# Patient Record
Sex: Male | Born: 1970 | Race: White | Hispanic: Yes | Marital: Single | Smoking: Never smoker
Health system: Southern US, Community
[De-identification: ages and names within clinical notes are randomized; demographics above are authoritative.]

## PROBLEM LIST (undated history)

## (undated) HISTORY — PX: APPENDECTOMY: SHX54

---

## 2016-06-11 ENCOUNTER — Encounter (HOSPITAL_COMMUNITY): Payer: Self-pay | Admitting: Emergency Medicine

## 2016-06-11 ENCOUNTER — Emergency Department (HOSPITAL_COMMUNITY): Payer: Self-pay

## 2016-06-11 ENCOUNTER — Emergency Department (HOSPITAL_COMMUNITY)
Admission: EM | Admit: 2016-06-11 | Discharge: 2016-06-11 | Disposition: A | Discharge status: Home / Self Care | Payer: Self-pay | Attending: Emergency Medicine | Admitting: Emergency Medicine

## 2016-06-11 DIAGNOSIS — Y9389 Activity, other specified: Secondary | ICD-10-CM | POA: Insufficient documentation

## 2016-06-11 DIAGNOSIS — S81811A Laceration without foreign body, right lower leg, initial encounter: Secondary | ICD-10-CM | POA: Insufficient documentation

## 2016-06-11 DIAGNOSIS — Y92009 Unspecified place in unspecified non-institutional (private) residence as the place of occurrence of the external cause: Secondary | ICD-10-CM | POA: Insufficient documentation

## 2016-06-11 DIAGNOSIS — W260XXA Contact with knife, initial encounter: Secondary | ICD-10-CM | POA: Insufficient documentation

## 2016-06-11 DIAGNOSIS — Z23 Encounter for immunization: Secondary | ICD-10-CM | POA: Insufficient documentation

## 2016-06-11 DIAGNOSIS — Y998 Other external cause status: Secondary | ICD-10-CM | POA: Insufficient documentation

## 2016-06-11 MED ORDER — HYDROCODONE-ACETAMINOPHEN 5-325 MG PO TABS: 1.0000 | ORAL_TABLET | Freq: Four times a day (QID) | ORAL | 0 refills | Status: AC | PRN

## 2016-06-11 MED ORDER — TETANUS-DIPHTH-ACELL PERTUSSIS 5-2.5-18.5 LF-MCG/0.5 IM SUSP
0.5000 mL | Freq: Once | INTRAMUSCULAR | Status: AC
  Administered 2016-06-11: 0.5 mL via INTRAMUSCULAR
  Filled 2016-06-11: qty 0.5

## 2016-06-11 MED ORDER — HYDROCODONE-ACETAMINOPHEN 5-325 MG PO TABS
1.0000 | ORAL_TABLET | Freq: Once | ORAL | Status: AC
  Administered 2016-06-11: 1 via ORAL
  Filled 2016-06-11: qty 1

## 2016-06-11 MED ORDER — LIDOCAINE-EPINEPHRINE (PF) 1 %-1:200000 IJ SOLN
INTRAMUSCULAR | Status: AC
  Filled 2016-06-11: qty 30

## 2016-06-11 MED ORDER — BACITRACIN ZINC 500 UNIT/GM EX OINT
TOPICAL_OINTMENT | Freq: Once | CUTANEOUS | Status: AC
  Administered 2016-06-11: 1 via TOPICAL
  Filled 2016-06-11: qty 0.9

## 2016-06-11 MED ORDER — LIDOCAINE-EPINEPHRINE 1 %-1:100000 IJ SOLN
20.0000 mL | Freq: Once | INTRAMUSCULAR | Status: DC
  Filled 2016-06-11: qty 20

## 2016-06-11 MED ORDER — POVIDONE-IODINE 10 % EX SOLN
CUTANEOUS | Status: AC
  Filled 2016-06-11: qty 118

## 2016-06-11 NOTE — ED Notes (Signed)
Medication applied to laceration, covered and gauze applied, pt a/o x 4, vss, RX and verbal and written discharge instructions given, verbalized understanding, pt ambulated off unit with steady gait

## 2016-06-11 NOTE — ED Triage Notes (Signed)
Cutting grass at home with a knife and accidentally cut in right leg.

## 2016-06-11 NOTE — ED Provider Notes (Signed)
[facility] DEPT Provider Note   CSN: [id number] Arrival date & time: 06/11/16  1741     History   Chief Complaint Chief Complaint  Patient presents with  . Laceration    HPI Derrall Hicks is a 46 y.o. male.   Laceration   The laceration is located on the right leg. The laceration is 4 cm in size. The laceration mechanism was a a metal edge. The pain is mild. The pain has been fluctuating since onset. He reports no foreign bodies present. His tetanus status is unknown.   46 year old male was mowing and a piece of the blade broke cutting his right leg just above the knee. Just prior to arrival. Worsened by movement better with rest. No others associated symptoms. Unknown last tetanus shot.  History reviewed. No pertinent past medical history.  There are no active problems to display for this patient.   Past Surgical History:  Procedure Laterality Date  . APPENDECTOMY         Home Medications    Prior to Admission medications   Medication Sig Start Date End Date Taking? Authorizing Provider  HYDROcodone-acetaminophen (NORCO/VICODIN) 5-325 MG tablet Take 1-2 tablets by mouth every 6 (six) hours as needed for severe pain. 06/11/16   Timiko Offutt, Barbara Cower, MD    Family History No family history on file.  Social History Social History  Substance Use Topics  . Smoking status: Never Smoker  . Smokeless tobacco: Never Used  . Alcohol use No     Allergies   Patient has no known allergies.   Review of Systems Review of Systems  All other systems reviewed and are negative.    Physical Exam Updated Vital Signs BP 137/77 (BP Location: Right Arm)   Pulse 83   Temp 98.2 F (36.8 C) (Oral)   Resp 20   Ht 5' 6.93" (1.7 m)   Wt 246 lb 14.6 oz (112 kg)   SpO2 97%   BMI 38.75 kg/m   Physical Exam  Constitutional: He is oriented to person, place, and time. He appears well-developed and well-nourished.  HENT:  Head: Normocephalic and atraumatic.  Eyes: Conjunctivae  and EOM are normal.  Neck: Normal range of motion.  Cardiovascular: Normal rate.   Pulmonary/Chest: Effort normal. No respiratory distress.  Abdominal: Soft. He exhibits no distension.  Musculoskeletal: Normal range of motion.  Neurological: He is alert and oriented to person, place, and time. No cranial nerve deficit. Coordination normal.  Skin: Skin is warm and dry.  4 cm hemostatic wound just above the right knee.  Nursing note and vitals reviewed.    ED Treatments / Results  Labs (all labs ordered are listed, but only abnormal results are displayed) Labs Reviewed - No data to display  EKG  EKG Interpretation None       Radiology Dg Knee 2 Views Right  Result Date: 06/11/2016 CLINICAL DATA:  Evaluation for foreign body following lawnmower injury to the right knee EXAM: RIGHT KNEE - 1-2 VIEW COMPARISON:  None. FINDINGS: No fracture or dislocation at the right knee. There is a large suprapatellar effusion with anterior soft tissue injury. No radiopaque foreign body. IMPRESSION: Large suprapatellar effusion. No radiopaque foreign body. No acute osseous abnormality. Electronically Signed   By: Deatra Robinson M.D.   On: 06/11/2016 19:00    Procedures .Marland KitchenLaceration Repair Date/Time: 06/11/2016 8:01 PM Performed by: Marily Memos Authorized by: Marily Memos   Consent:    Consent obtained:  Verbal   Consent given by:  Patient  Risks discussed:  Pain, infection, poor cosmetic result and poor wound healing   Alternatives discussed:  No treatment and delayed treatment Anesthesia (see MAR for exact dosages):    Anesthesia method:  Local infiltration   Local anesthetic:  Lidocaine 1% WITH epi Laceration details:    Location:  Leg   Leg location:  R upper leg   Length (cm):  4   Depth (mm):  12 Repair type:    Repair type:  Intermediate Pre-procedure details:    Preparation:  Patient was prepped and draped in usual sterile fashion and imaging obtained to evaluate for foreign  bodies Exploration:    Hemostasis achieved with:  Epinephrine   Wound exploration: wound explored through full range of motion and entire depth of wound probed and visualized     Wound extent: areolar tissue violated and fascia violated     Wound extent: no foreign bodies/material noted, no muscle damage noted, no nerve damage noted, no tendon damage noted, no underlying fracture noted and no vascular damage noted     Contaminated: no   Treatment:    Area cleansed with:  Betadine and saline   Amount of cleaning:  Standard   Irrigation solution:  Sterile saline   Irrigation volume:  300   Irrigation method:  Syringe   Visualized foreign bodies/material removed: no   Subcutaneous repair:    Suture size:  4-0   Suture material:  Vicryl   Suture technique:  Simple interrupted   Number of sutures:  3 Skin repair:    Repair method:  Sutures   Suture size:  3-0   Suture material:  Prolene   Suture technique:  Simple interrupted   Number of sutures:  9 Approximation:    Approximation:  Close   Vermilion border: well-aligned   Post-procedure details:    Dressing:  Antibiotic ointment   Patient tolerance of procedure:  Tolerated well, no immediate complications   (including critical care time)  Medications Ordered in ED Medications  povidone-iodine (BETADINE) 10 % external solution (not administered)  lidocaine-EPINEPHrine (XYLOCAINE W/EPI) 1 %-1:100000 (with pres) injection 20 mL (not administered)  lidocaine-EPINEPHrine (XYLOCAINE-EPINEPHrine) 1 %-1:200000 (PF) injection (not administered)  HYDROcodone-acetaminophen (NORCO/VICODIN) 5-325 MG per tablet 1 tablet (1 tablet Oral Given 06/11/16 1831)  Tdap (BOOSTRIX) injection 0.5 mL (0.5 mLs Intramuscular Given 06/11/16 1826)  bacitracin ointment (1 application Topical Given 06/11/16 1952)     Initial Impression / Assessment and Plan / ED Course  I have reviewed the triage vital signs and the nursing notes.  Pertinent labs & imaging  results that were available during my care of the patient were reviewed by me and considered in my medical decision making (see chart for details).     Lac repaired as above. TDAP updated. No evidence of patella injury or patellar tendon. No foreign bodies. NVI distally. Strong DP pulse. Will return in 10 days for suture removal.   Final Clinical Impressions(s) / ED Diagnoses   Final diagnoses:  Laceration of right lower extremity, initial encounter    New Prescriptions New Prescriptions   HYDROCODONE-ACETAMINOPHEN (NORCO/VICODIN) 5-325 MG TABLET    Take 1-2 tablets by mouth every 6 (six) hours as needed for severe pain.     Marily MemosMesner, Donnel Venuto, MD 06/11/16 2004

## 2017-09-29 IMAGING — DX DG KNEE 1-2V*R*
2 series · 2 of 2 positions shown · non-contrast
Comparison: None.

CLINICAL DATA: Evaluation for foreign body following lawnmower
injury to the right knee

EXAM:
RIGHT KNEE - 1-2 VIEW

[knee ap]
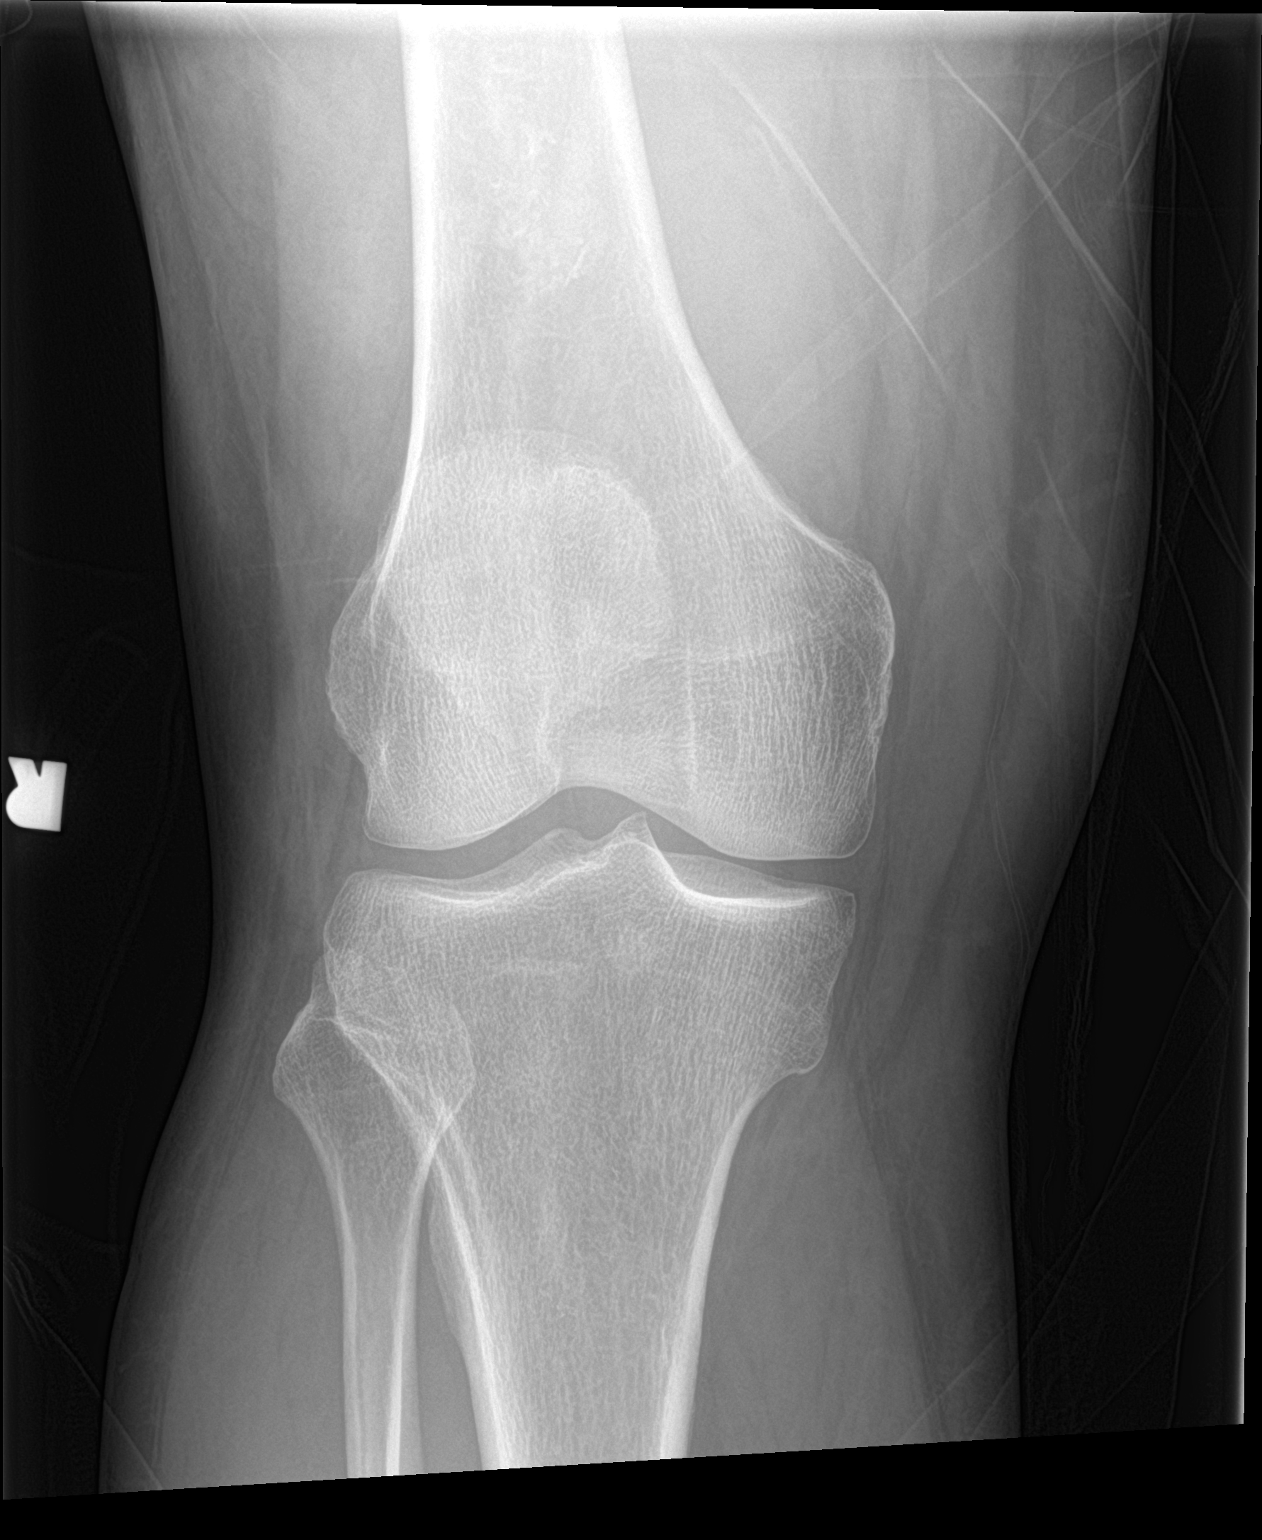

[knee lat]
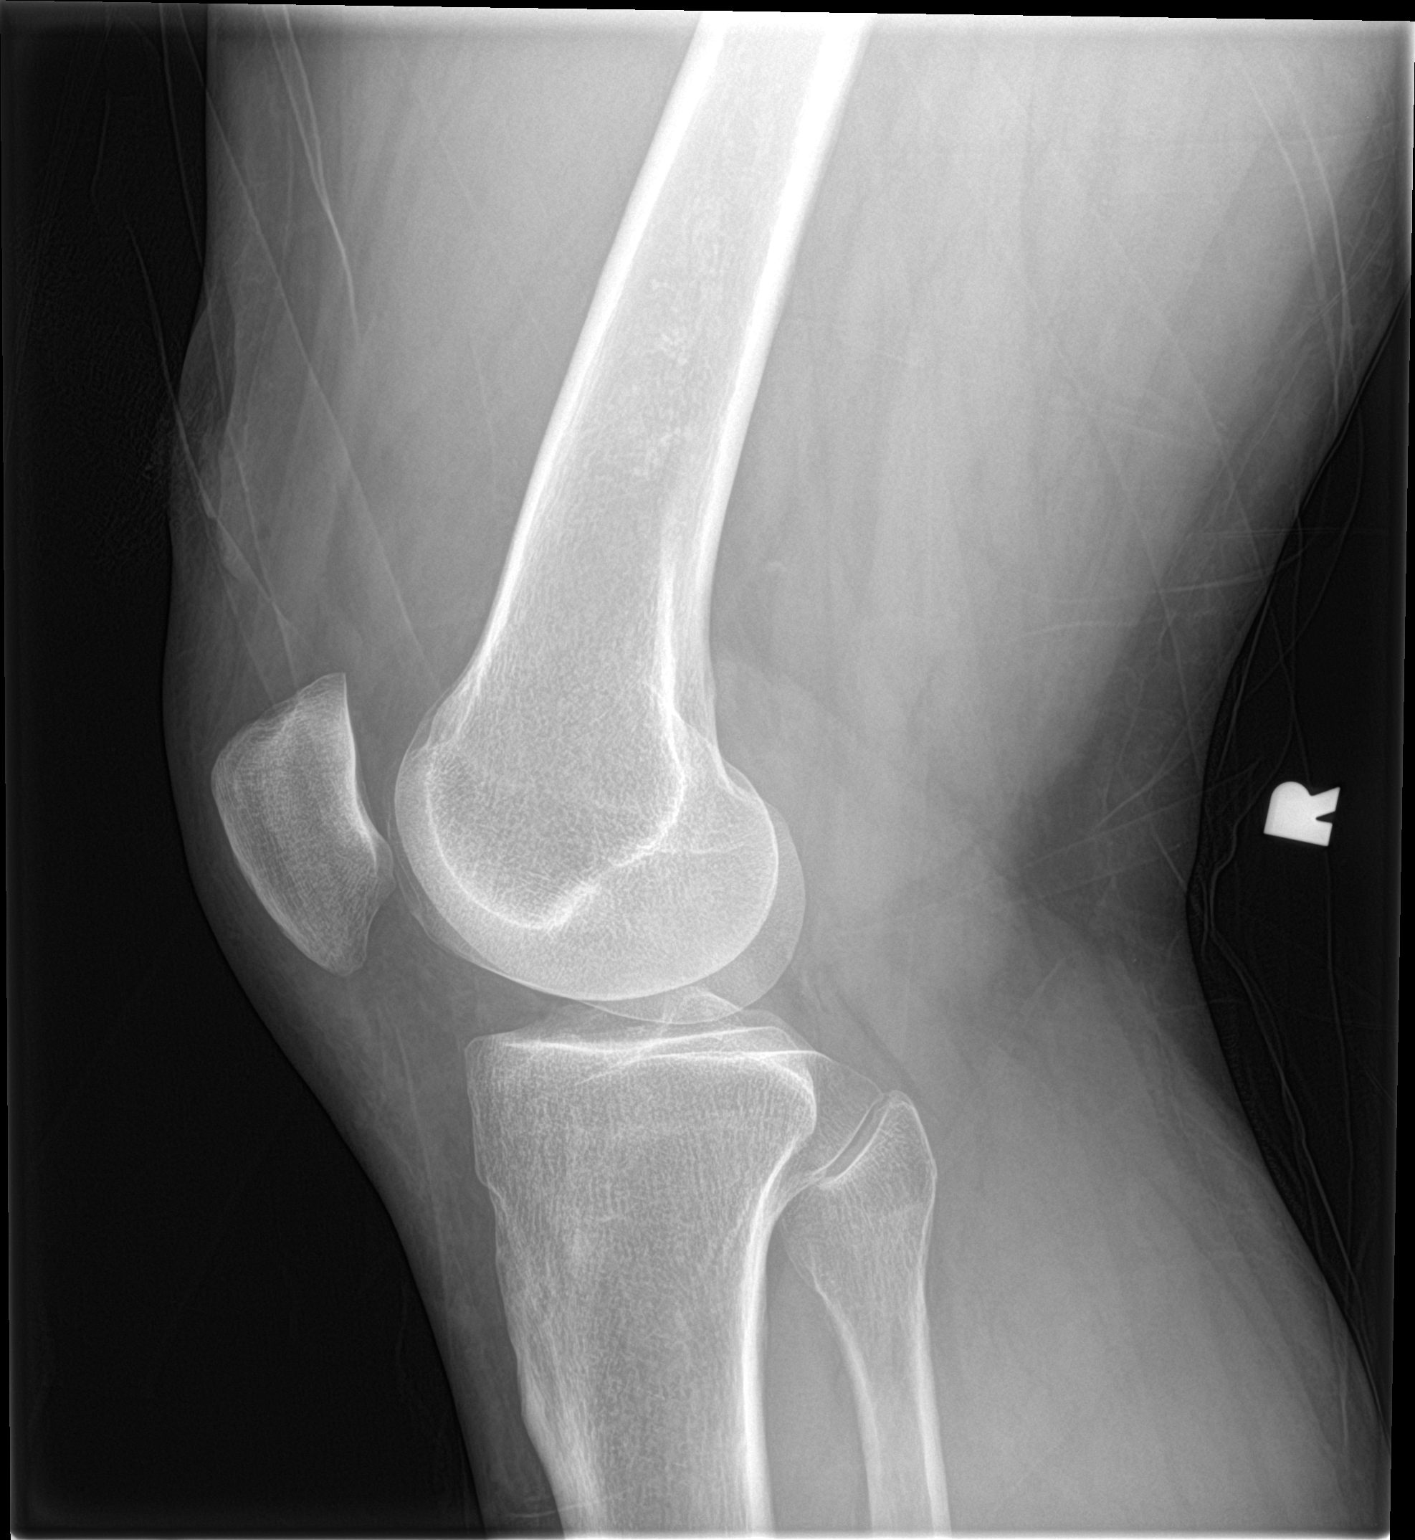

[2 of 2 positions shown; findings below may reference images not displayed]

FINDINGS: No fracture or dislocation at the right knee. There is a large
suprapatellar effusion with anterior soft tissue injury. No
radiopaque foreign body.
IMPRESSION: Large suprapatellar effusion. No radiopaque foreign body. No acute
osseous abnormality.
